# Patient Record
Sex: Female | Born: 1960 | Race: White | Hispanic: No | Marital: Married | Smoking: Never smoker
Health system: Southern US, Community
[De-identification: ages and names within clinical notes are randomized; demographics above are authoritative.]

---

## 2008-03-04 ENCOUNTER — Encounter: Admission: RE | Admit: 2008-03-04 | Discharge: 2008-03-04 | Payer: Self-pay | Admitting: Internal Medicine

## 2013-02-11 ENCOUNTER — Emergency Department (INDEPENDENT_AMBULATORY_CARE_PROVIDER_SITE_OTHER): Payer: Worker's Compensation

## 2013-02-11 ENCOUNTER — Emergency Department (INDEPENDENT_AMBULATORY_CARE_PROVIDER_SITE_OTHER)
Admission: EM | Admit: 2013-02-11 | Discharge: 2013-02-11 | Disposition: A | Discharge status: Home / Self Care | Payer: Worker's Compensation | Source: Home / Self Care | Attending: Family Medicine | Admitting: Family Medicine

## 2013-02-11 ENCOUNTER — Encounter (HOSPITAL_COMMUNITY): Payer: Self-pay | Admitting: Emergency Medicine

## 2013-02-11 DIAGNOSIS — S6390XA Sprain of unspecified part of unspecified wrist and hand, initial encounter: Secondary | ICD-10-CM | POA: Diagnosis not present

## 2013-02-11 DIAGNOSIS — S63619A Unspecified sprain of unspecified finger, initial encounter: Secondary | ICD-10-CM

## 2013-02-11 NOTE — ED Provider Notes (Signed)
CSN: [id number]     Arrival date & time 02/11/13  1723 History   First MD Initiated Contact with Patient 02/11/13 1817     Chief Complaint  Patient presents with  . Finger Injury   (Consider location/radiation/quality/duration/timing/severity/associated sxs/prior Treatment) Patient is a 52 y.o. female presenting with hand injury. The history is provided by the patient.  Hand Injury Location:  Finger Time since incident:  6 hours Injury: yes   Mechanism of injury comment:  Door opened into hand. Finger location:  R index finger Pain details:    Radiates to:  Does not radiate   Severity:  Mild   Progression:  Unchanged Chronicity:  New Dislocation: no   Foreign body present:  No foreign bodies Prior injury to area:  No   History reviewed. No pertinent past medical history. No past surgical history on file. No family history on file. History  Substance Use Topics  . Smoking status: Not on file  . Smokeless tobacco: Not on file  . Alcohol Use: Not on file   OB History   Grav Para Term Preterm Abortions TAB SAB Ect Mult Living                 Review of Systems  Constitutional: Negative.   Musculoskeletal: Negative for joint swelling and gait problem.  Skin: Negative.     Allergies  Penicillins and Sulfur  Home Medications  No current outpatient prescriptions on file. There were no vitals taken for this visit. Physical Exam  Nursing note and vitals reviewed. Constitutional: She is oriented to person, place, and time. She appears well-developed and well-nourished.  Musculoskeletal: She exhibits no tenderness.       Hands: Neurological: She is alert and oriented to person, place, and time.  Skin: Skin is warm and dry.    ED Course  Procedures (including critical care time) Labs Review Labs Reviewed - No data to display Imaging Review Dg Hand Complete Right  02/11/2013   *RADIOLOGY REPORT*  Clinical Data: Right index finger pain, trauma  RIGHT HAND - COMPLETE  3+ VIEW  Comparison: None  Findings: Three views of the right hand demonstrate no acute fracture or malalignment.  Mild soft tissue swelling about the distal aspect of the index finger.  No advanced degenerative changes.  Normal bony mineralization.  Carpus appears intact and congruent.  IMPRESSION: No acute fracture or malalignment.   Original Report Authenticated By: Malachy Moan, M.D.    MDM  X-rays reviewed and report per radiologist.     Linna Hoff, MD 02/11/13 [phone]

## 2013-02-11 NOTE — ED Notes (Signed)
C/o right index finger in pain which happened this afternoon as the patient was trying to go  Through a door a employee was coming through the door and patient finger was jammed by door.

## 2016-11-02 ENCOUNTER — Encounter (HOSPITAL_BASED_OUTPATIENT_CLINIC_OR_DEPARTMENT_OTHER): Payer: Self-pay

## 2016-11-02 ENCOUNTER — Emergency Department (HOSPITAL_BASED_OUTPATIENT_CLINIC_OR_DEPARTMENT_OTHER)
Admission: EM | Admit: 2016-11-02 | Discharge: 2016-11-02 | Disposition: A | Discharge status: Home / Self Care | Payer: 59 | Attending: Emergency Medicine | Admitting: Emergency Medicine

## 2016-11-02 ENCOUNTER — Emergency Department (HOSPITAL_BASED_OUTPATIENT_CLINIC_OR_DEPARTMENT_OTHER): Payer: 59

## 2016-11-02 DIAGNOSIS — M25562 Pain in left knee: Secondary | ICD-10-CM | POA: Diagnosis present

## 2016-11-02 DIAGNOSIS — S86812A Strain of other muscle(s) and tendon(s) at lower leg level, left leg, initial encounter: Secondary | ICD-10-CM | POA: Diagnosis not present

## 2016-11-02 DIAGNOSIS — Y929 Unspecified place or not applicable: Secondary | ICD-10-CM | POA: Diagnosis not present

## 2016-11-02 DIAGNOSIS — Y9301 Activity, walking, marching and hiking: Secondary | ICD-10-CM | POA: Diagnosis not present

## 2016-11-02 DIAGNOSIS — S86912A Strain of unspecified muscle(s) and tendon(s) at lower leg level, left leg, initial encounter: Secondary | ICD-10-CM

## 2016-11-02 DIAGNOSIS — Y999 Unspecified external cause status: Secondary | ICD-10-CM | POA: Diagnosis not present

## 2016-11-02 DIAGNOSIS — X58XXXA Exposure to other specified factors, initial encounter: Secondary | ICD-10-CM | POA: Diagnosis not present

## 2016-11-02 NOTE — ED Provider Notes (Signed)
[facility] Note   CSN: [id number] Arrival date & time: 11/02/16  1305 By signing my name below, I, Levon HedgerElizabeth Hall, attest that this documentation has been prepared under the direction and in the presence of Benjiman CorePickering, Shenise Wolgamott, MD . Electronically Signed: Levon HedgerElizabeth Hall, Scribe. 11/02/2016. 4:21 PM.   History   Chief Complaint Chief Complaint  Patient presents with  . Knee Pain   HPI Melissa Keith is a 56 y.o. female who presents to the Emergency Department complaining of progressively worsening, moderate left lateral knee pain onset yesterday. Pt states she was turning while at today work and felt her knee "crackle". Pain is exacerbated by palpation and bearing weight on her left leg. Pt tried OTC medication with some relief last night. She is currently employed at [employer] and is on her feet for long hours.  No recent fall or trauma. Pt denies any weakness or numbness and has no other acute complaints or associated symptoms at this time.    The history is provided by the patient. No language interpreter was used.   History reviewed. No pertinent past medical history.  There are no active problems to display for this patient.   History reviewed. No pertinent surgical history.  OB History    No data available      Home Medications    Prior to Admission medications   Not on File    Family History No family history on file.  Social History Social History  Substance Use Topics  . Smoking status: Never Smoker  . Smokeless tobacco: Never Used  . Alcohol use No     Allergies   Penicillins and Sulfur   Review of Systems Review of Systems  Musculoskeletal: Positive for arthralgias and myalgias.  Skin: Negative for wound.  Neurological: Negative for weakness and numbness.   Physical Exam Updated Vital Signs BP (!) 183/103 (BP Location: Right Arm)   Pulse 80   Temp 98.3 F (36.8 C) (Oral)   Resp 18   Ht 5\' 3"  (1.6 m)   Wt 74.8 kg (165 lb)    SpO2 99%   BMI 29.23 kg/m   Physical Exam  Constitutional: She is oriented to person, place, and time. She appears well-developed and well-nourished. No distress.  HENT:  Head: Normocephalic and atraumatic.  Eyes: Conjunctivae are normal.  Cardiovascular: Normal rate.   Pulmonary/Chest: Effort normal.  Abdominal: She exhibits no distension.  Musculoskeletal:  No tenderness over hip. No swelling over lower leg. Some tenderness to posterior knee, more medial than lateral. Mild pain with active flexion and extension of the knee. Small ecchymotic area over the knee without tenderness. Mild pain medially with varus strain. Neurovascularly intact in the foot.   Neurological: She is alert and oriented to person, place, and time.  Skin: Skin is warm and dry.  Nursing note and vitals reviewed.  ED Treatments / Results  DIAGNOSTIC STUDIES:  Oxygen Saturation is 100% on RA, normal by my interpretation.    COORDINATION OF CARE:  3:48 PM Will d/c home. Discussed treatment plan with pt at bedside and pt agreed to plan.   Labs (all labs ordered are listed, but only abnormal results are displayed) Labs Reviewed - No data to display  EKG  EKG Interpretation None       Radiology Dg Knee Complete 4 Views Left  Result Date: 11/02/2016 CLINICAL DATA:  Pt repots pain in the L knee. Pt works on her feet for long hours. No incidence of injury.  Pain started yesterday and worse today. , pt left knee is visibly swollen c/w right knee, pt can barely put pressure to stand on left knee EXAM: LEFT KNEE - COMPLETE 4+ VIEW COMPARISON:  None. FINDINGS: No fracture or bone lesion. The knee joint is normally spaced and aligned. No arthropathic change. No joint effusion. Possible edema along the popliteal fossa region of the knee versus a popliteal cyst. Soft tissues otherwise unremarkable. IMPRESSION: 1. No fracture or bone lesion. No knee joint arthropathic change or joint effusion. 2. Popliteal region soft  tissue edema versus a popliteal cyst suggested. Electronically Signed   By: Amie Portland M.D.   On: 11/02/2016 14:16    Procedures Procedures (including critical care time)  Medications Ordered in ED Medications - No data to display   Initial Impression / Assessment and Plan / ED Course  I have reviewed the triage vital signs and the nursing notes.  Pertinent labs & imaging results that were available during my care of the patient were reviewed by me and considered in my medical decision making (see chart for details).   patient with knee pain. Overall benign exam. X-ray reassuring. Discharge home follow-up as needed.  Final Clinical Impressions(s) / ED Diagnoses   Final diagnoses:  Strain of left knee, initial encounter    New Prescriptions There are no discharge medications for this patient.  I personally performed the services described in this documentation, which was scribed in my presence. The recorded information has been reviewed and is accurate.      Benjiman Core, MD 11/02/16 2329

## 2016-11-02 NOTE — ED Notes (Signed)
Discussed need for f/u for the high blood pressure today.

## 2016-11-02 NOTE — ED Triage Notes (Signed)
Pt repots pain in the L knee. Pt works on her feet for long hours. No incidence of injury. Pain started yesterday and worse today.

## 2019-02-17 IMAGING — DX DG KNEE COMPLETE 4+V*L*
4 series · 4 of 4 positions shown · non-contrast
Comparison: None.

CLINICAL DATA: Pt repots pain in the L knee. Pt works on her feet
for long hours. No incidence of injury. Pain started yesterday and
worse today. , pt left knee is visibly swollen c/w right knee, pt
can barely put pressure to stand on left knee

EXAM:
LEFT KNEE - COMPLETE 4+ VIEW

[knee ap]
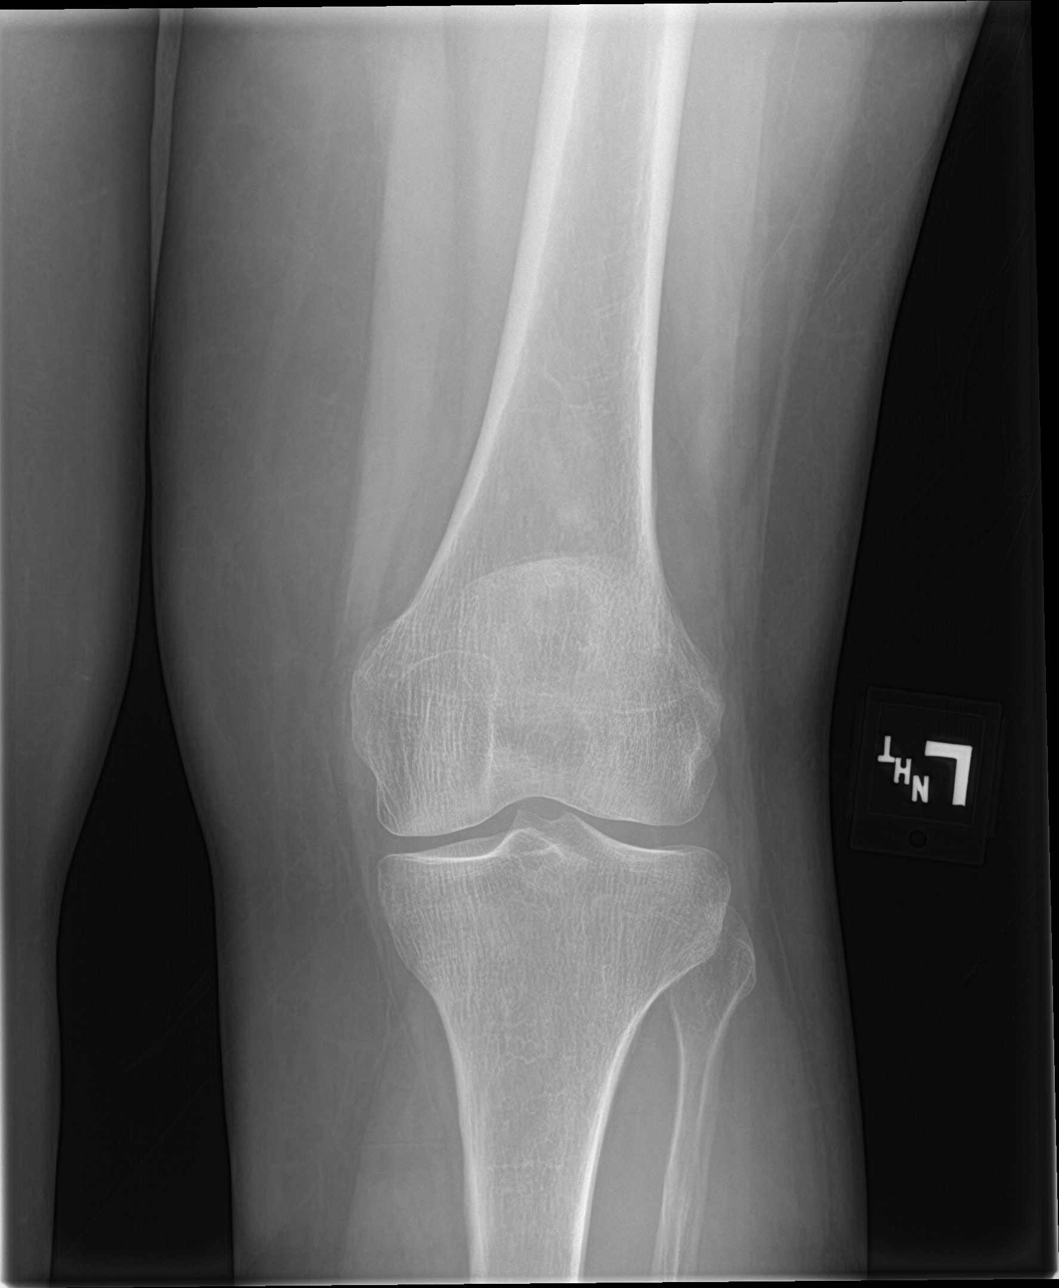

[knee lat]
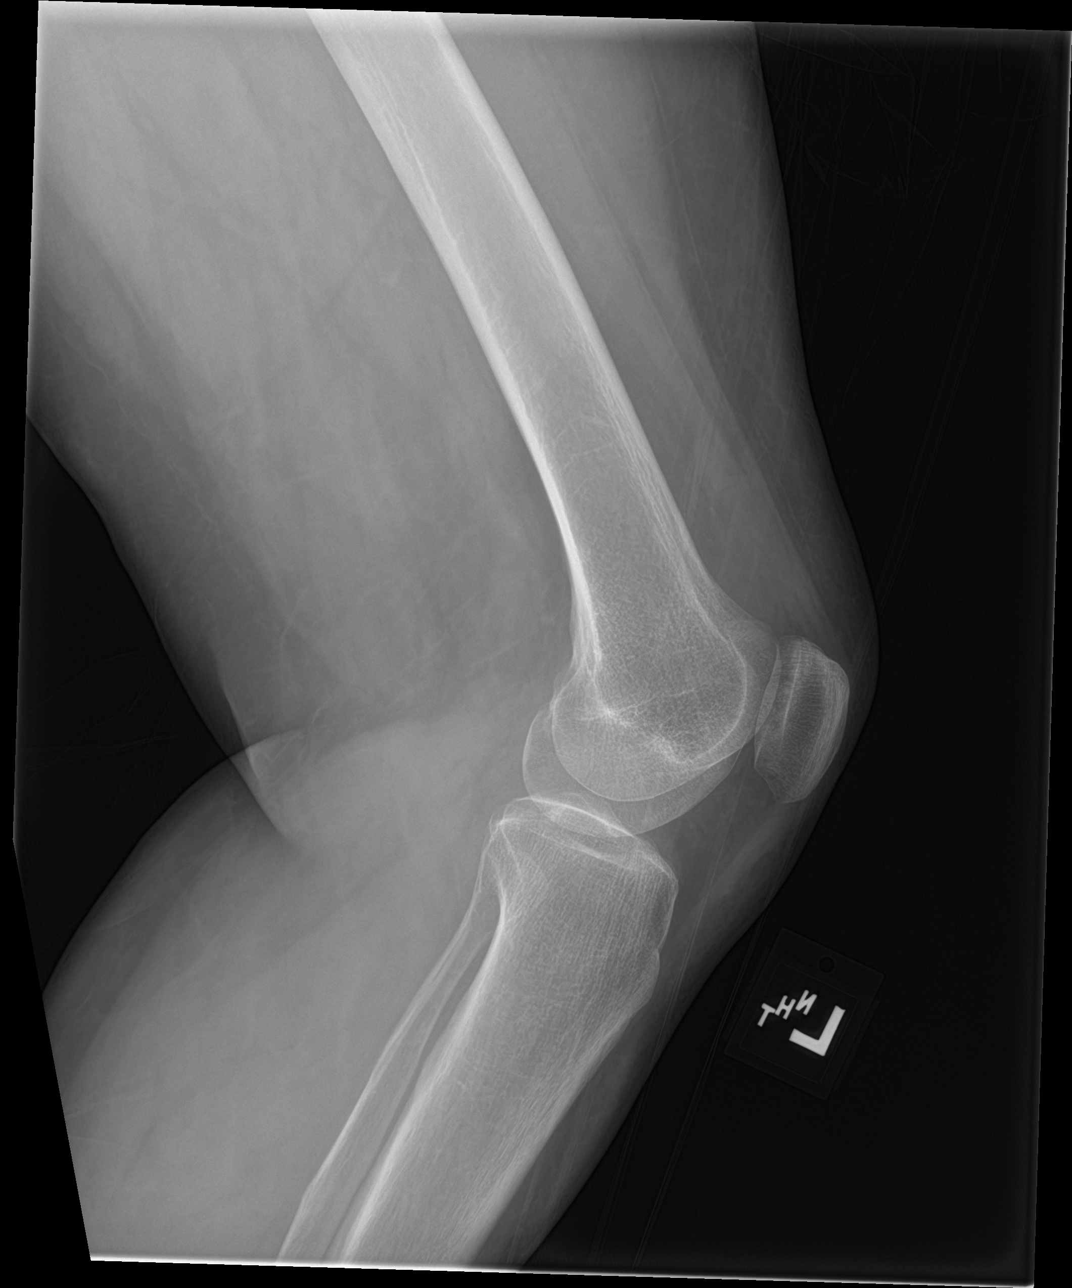

[knee obl (1 of 2)]
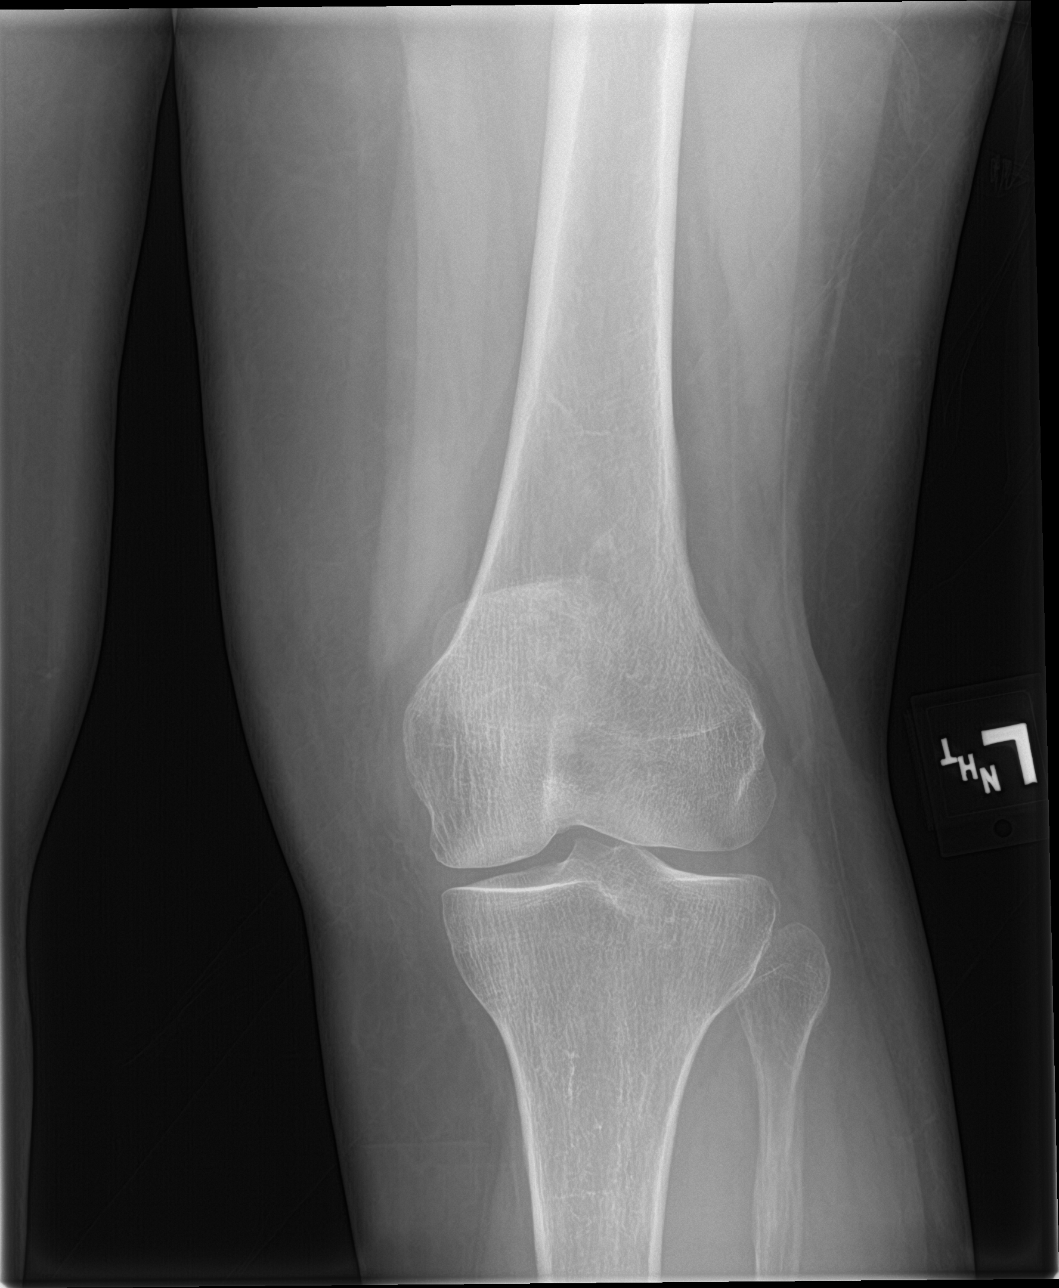

[knee obl (2 of 2)]
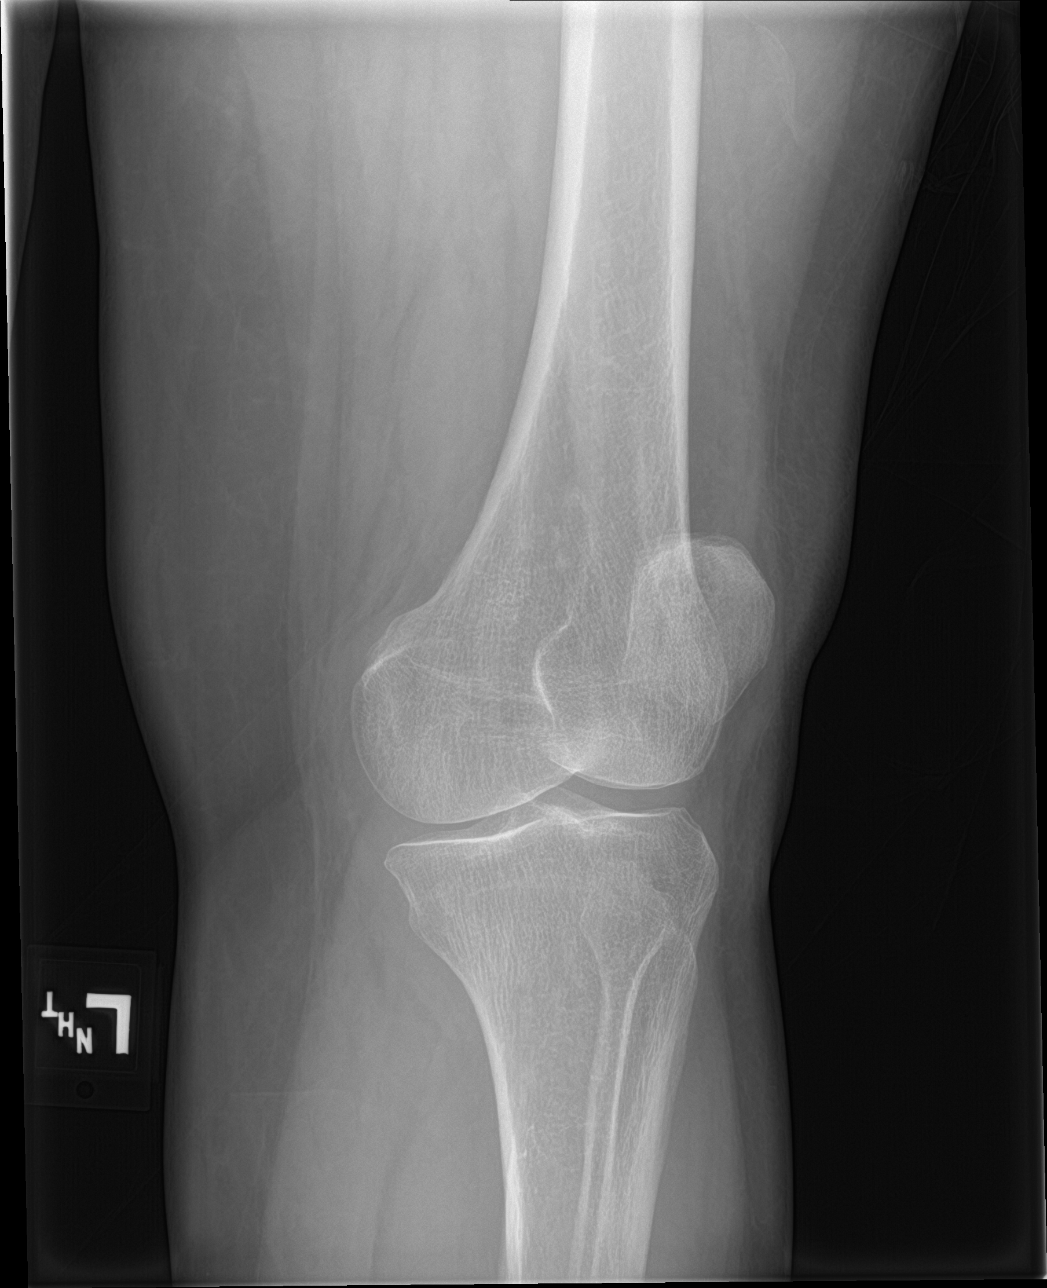

[4 of 4 positions shown; findings below may reference images not displayed]

FINDINGS: No fracture or bone lesion.

The knee joint is normally spaced and aligned. No arthropathic
change. No joint effusion.

Possible edema along the popliteal fossa region of the knee versus a
popliteal cyst. Soft tissues otherwise unremarkable.
IMPRESSION: 1. No fracture or bone lesion. No knee joint arthropathic change or
joint effusion.
2. Popliteal region soft tissue edema versus a popliteal cyst
suggested.
